# Patient Record
Sex: Male | Born: 2000 | Race: White | Hispanic: No | Marital: Single | Smoking: Never smoker
Health system: Southern US, Community
[De-identification: ages and names within clinical notes are randomized; demographics above are authoritative.]

## PROBLEM LIST (undated history)

## (undated) DIAGNOSIS — K219 Gastro-esophageal reflux disease without esophagitis: Secondary | ICD-10-CM

## (undated) DIAGNOSIS — A770 Spotted fever due to Rickettsia rickettsii: Secondary | ICD-10-CM

## (undated) DIAGNOSIS — R5600 Simple febrile convulsions: Secondary | ICD-10-CM

## (undated) DIAGNOSIS — R569 Unspecified convulsions: Secondary | ICD-10-CM

## (undated) DIAGNOSIS — E669 Obesity, unspecified: Secondary | ICD-10-CM

## (undated) DIAGNOSIS — Z8489 Family history of other specified conditions: Secondary | ICD-10-CM

## (undated) DIAGNOSIS — G43909 Migraine, unspecified, not intractable, without status migrainosus: Secondary | ICD-10-CM

## (undated) DIAGNOSIS — Z8669 Personal history of other diseases of the nervous system and sense organs: Secondary | ICD-10-CM

## (undated) DIAGNOSIS — S83289A Other tear of lateral meniscus, current injury, unspecified knee, initial encounter: Secondary | ICD-10-CM

## (undated) HISTORY — PX: CIRCUMCISION: SUR203

## (undated) HISTORY — PX: NO PAST SURGERIES: SHX2092

## (undated) HISTORY — DX: Migraine, unspecified, not intractable, without status migrainosus: G43.909

---
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
## 2020-02-21 ENCOUNTER — Ambulatory Visit: Payer: Self-pay | Admitting: Physician Assistant

## 2020-02-21 ENCOUNTER — Encounter (HOSPITAL_BASED_OUTPATIENT_CLINIC_OR_DEPARTMENT_OTHER): Payer: Self-pay | Admitting: Orthopedic Surgery

## 2020-02-21 NOTE — H&P (View-Only) (Signed)
Warwick Nick is an 19 y.o. male.   Chief Complaint: left knee pain HPI: History of present illness: Patient is an 19 year-old male who presents with over a year now of left knee pain.  He did mention a distant history back in high school playing basketball when he had an injury to the left knee where he had to be in an immobilizer or brace for several months.  He does not recall ever having an official workup or MRI at that point.  He was told he may have a tear, but again, the symptoms did tend to improve.  Over the last year still dealing with stabbing pain on occasion.  A lot of it is positional.  He does get a lot of mechanical clicking, popping and more importantly the sensation of the knee wanting to give way.  Here for orthopedic evaluation and treatment.  We elected to obtain MRI which shows lateral meniscus tear.    Past Medical History:  Diagnosis Date  . Febrile seizure (HCC)   . GERD (gastroesophageal reflux disease)   . Migraine   . Obese   . [place] spotted fever     Past Surgical History:  Procedure Laterality Date  . CIRCUMCISION     2002    Family History  Problem Relation Age of Onset  . Diabetes Sister   . Anuerysm Paternal Grandfather        Died in his 56's   . Heart disease Other   . Arthritis Other   . Cancer Other   . Asthma Other   . Diabetes Other    Social History:  reports that he has never smoked. He has never used smokeless tobacco. He reports that he does not drink alcohol or use drugs.  Allergies:  Allergies  Allergen Reactions  . Azithromycin Nausea And Vomiting and Swelling    (Not in a hospital admission)   No results found for this or any previous visit (from the past 48 hour(s)). No results found.  Review of Systems  Musculoskeletal: Positive for arthralgias and joint swelling.  All other systems reviewed and are negative.   There were no vitals taken for this visit. Physical Exam  Constitutional: He is oriented to  person, place, and time. He appears well-developed and well-nourished. No distress.  HENT:  Head: Normocephalic and atraumatic.  Eyes: Pupils are equal, round, and reactive to light. Conjunctivae and EOM are normal.  Cardiovascular: Normal rate and intact distal pulses.  Respiratory: Effort normal. No respiratory distress.  GI: Soft. He exhibits no distension.  Musculoskeletal:     Cervical back: Normal range of motion and neck supple.     Left knee: Swelling present. No effusion or erythema. Tenderness present over the lateral joint line.  Neurological: He is alert and oriented to person, place, and time.  Skin: Skin is warm and dry. No rash noted. No erythema.  Psychiatric: He has a normal mood and affect. His behavior is normal.     Assessment/Plan The patient has a known meniscus tear.  He is 19 years old.  I talked to his mom.  He has no insurance coverage.  Unfortunately after talking with mom, his dad works and stated when they were offered insurance through his employment it was too expensive.  Earlier they did not qualify for Medicaid.  I encouraged her to look into that a secondary time.  We can obviously work with the family regarding what needs to be done, which is an  arthroscopy.  The issue is probably as much with cost relative to facility, anesthesia, etc.  I will get my office in touch with the family regarding that.  Hopefully we can find another way to get this covered, but I am going to try to help them to the best of my ability.  I will have [name] touch base with them to stay in touch.    Damaya Channing, PA-C 02/21/2020, 8:58 PM   

## 2020-02-21 NOTE — H&P (Signed)
Angel Kelley is an 19 y.o. male.   Chief Complaint: left knee pain HPI: History of present illness: Patient is an 19 year-old male who presents with over a year now of left knee pain.  He did mention a distant history back in high school playing basketball when he had an injury to the left knee where he had to be in an immobilizer or brace for several months.  He does not recall ever having an official workup or MRI at that point.  He was told he may have a tear, but again, the symptoms did tend to improve.  Over the last year still dealing with stabbing pain on occasion.  A lot of it is positional.  He does get a lot of mechanical clicking, popping and more importantly the sensation of the knee wanting to give way.  Here for orthopedic evaluation and treatment.  We elected to obtain MRI which shows lateral meniscus tear.    Past Medical History:  Diagnosis Date  . Febrile seizure (HCC)   . GERD (gastroesophageal reflux disease)   . Migraine   . Obese   . [place] spotted fever     Past Surgical History:  Procedure Laterality Date  . CIRCUMCISION     2002    Family History  Problem Relation Age of Onset  . Diabetes Sister   . Anuerysm Paternal Grandfather        Died in his 56's   . Heart disease Other   . Arthritis Other   . Cancer Other   . Asthma Other   . Diabetes Other    Social History:  reports that he has never smoked. He has never used smokeless tobacco. He reports that he does not drink alcohol or use drugs.  Allergies:  Allergies  Allergen Reactions  . Azithromycin Nausea And Vomiting and Swelling    (Not in a hospital admission)   No results found for this or any previous visit (from the past 48 hour(s)). No results found.  Review of Systems  Musculoskeletal: Positive for arthralgias and joint swelling.  All other systems reviewed and are negative.   There were no vitals taken for this visit. Physical Exam  Constitutional: He is oriented to  person, place, and time. He appears well-developed and well-nourished. No distress.  HENT:  Head: Normocephalic and atraumatic.  Eyes: Pupils are equal, round, and reactive to light. Conjunctivae and EOM are normal.  Cardiovascular: Normal rate and intact distal pulses.  Respiratory: Effort normal. No respiratory distress.  GI: Soft. He exhibits no distension.  Musculoskeletal:     Cervical back: Normal range of motion and neck supple.     Left knee: Swelling present. No effusion or erythema. Tenderness present over the lateral joint line.  Neurological: He is alert and oriented to person, place, and time.  Skin: Skin is warm and dry. No rash noted. No erythema.  Psychiatric: He has a normal mood and affect. His behavior is normal.     Assessment/Plan The patient has a known meniscus tear.  He is 19 years old.  I talked to his mom.  He has no insurance coverage.  Unfortunately after talking with mom, his dad works and stated when they were offered insurance through his employment it was too expensive.  Earlier they did not qualify for Medicaid.  I encouraged her to look into that a secondary time.  We can obviously work with the family regarding what needs to be done, which is an  arthroscopy.  The issue is probably as much with cost relative to facility, anesthesia, etc.  I will get my office in touch with the family regarding that.  Hopefully we can find another way to get this covered, but I am going to try to help them to the best of my ability.  I will have Angel Kelley touch base with them to stay in touch.    Chriss Czar, PA-C 02/21/2020, 8:58 PM

## 2020-02-22 ENCOUNTER — Other Ambulatory Visit: Payer: Self-pay

## 2020-02-22 ENCOUNTER — Encounter (HOSPITAL_BASED_OUTPATIENT_CLINIC_OR_DEPARTMENT_OTHER): Payer: Self-pay | Admitting: Orthopedic Surgery

## 2020-02-22 NOTE — Progress Notes (Addendum)
Addendum: spoke with [place] zanetto pa ok to proceed    Spoke w/ via phone for pre-op interview---patient Lab needs dos----none               COVID test ------02-26-2020 310 at [facility] Arrive at -------800 am 02-29-2020 NPO after ------midnight Medications to take morning of surgery -----none Diabetic medication -----n/a Patient Special Instructions -----none Pre-Op special Istructions -----none Patient verbalized understanding of instructions that were given at this phone interview. Patient denies shortness of breath, chest pain, fever, cough a this phone interview.  Anesthesia : history of febrile seizures, bmi 43.65 Chart sent to Shanda Bumps zanetto pa for review  PCP: dr Lorin Picket [place] Cardiologist :none Chest x-ray :none EKG :none Echo :none Cardiac Cath : none Sleep Study/ CPAP : Fasting Blood Sugar :      / Checks Blood Sugar -- times a day:  n/a Blood Thinner/ Instructions /Last Dose:n/a ASA / Instructions/ Last Dose : n/a  Patient denies shortness of breath, chest pain, fever, and cough at this phone interview.

## 2020-02-26 ENCOUNTER — Other Ambulatory Visit (HOSPITAL_COMMUNITY)
Admission: RE | Admit: 2020-02-26 | Discharge: 2020-02-26 | Disposition: A | Discharge status: Home / Self Care | Payer: HRSA Program | Source: Ambulatory Visit | Attending: Orthopedic Surgery | Admitting: Orthopedic Surgery

## 2020-02-26 ENCOUNTER — Other Ambulatory Visit: Payer: Self-pay

## 2020-02-26 DIAGNOSIS — Z20822 Contact with and (suspected) exposure to covid-19: Secondary | ICD-10-CM | POA: Diagnosis not present

## 2020-02-26 DIAGNOSIS — Z01812 Encounter for preprocedural laboratory examination: Secondary | ICD-10-CM | POA: Diagnosis present

## 2020-02-26 LAB — SARS CORONAVIRUS 2 (TAT 6-24 HRS): SARS Coronavirus 2: NEGATIVE

## 2020-02-28 MED ORDER — DEXTROSE 5 % IV SOLN
3.0000 g | INTRAVENOUS | Status: AC
  Administered 2020-02-29: 3 g via INTRAVENOUS
  Filled 2020-02-28: qty 3000

## 2020-02-28 MED ORDER — DEXTROSE 5 % IV SOLN
3.0000 g | INTRAVENOUS | Status: DC
  Filled 2020-02-28: qty 3000

## 2020-02-29 ENCOUNTER — Ambulatory Visit (HOSPITAL_BASED_OUTPATIENT_CLINIC_OR_DEPARTMENT_OTHER): Payer: Self-pay | Admitting: Physician Assistant

## 2020-02-29 ENCOUNTER — Ambulatory Visit (HOSPITAL_BASED_OUTPATIENT_CLINIC_OR_DEPARTMENT_OTHER)
Admission: RE | Admit: 2020-02-29 | Discharge: 2020-02-29 | Disposition: A | Discharge status: Home / Self Care | Payer: Self-pay | Attending: Orthopedic Surgery | Admitting: Orthopedic Surgery

## 2020-02-29 ENCOUNTER — Encounter (HOSPITAL_BASED_OUTPATIENT_CLINIC_OR_DEPARTMENT_OTHER): Payer: Self-pay | Admitting: Orthopedic Surgery

## 2020-02-29 ENCOUNTER — Other Ambulatory Visit: Payer: Self-pay

## 2020-02-29 ENCOUNTER — Encounter (HOSPITAL_BASED_OUTPATIENT_CLINIC_OR_DEPARTMENT_OTHER)
Admission: RE | Disposition: A | Discharge status: Home / Self Care | Payer: Self-pay | Source: Home / Self Care | Attending: Orthopedic Surgery

## 2020-02-29 DIAGNOSIS — K219 Gastro-esophageal reflux disease without esophagitis: Secondary | ICD-10-CM | POA: Insufficient documentation

## 2020-02-29 DIAGNOSIS — S83282A Other tear of lateral meniscus, current injury, left knee, initial encounter: Secondary | ICD-10-CM | POA: Insufficient documentation

## 2020-02-29 DIAGNOSIS — Z68.41 Body mass index (BMI) pediatric, greater than or equal to 95th percentile for age: Secondary | ICD-10-CM | POA: Insufficient documentation

## 2020-02-29 DIAGNOSIS — X58XXXA Exposure to other specified factors, initial encounter: Secondary | ICD-10-CM | POA: Insufficient documentation

## 2020-02-29 HISTORY — DX: Simple febrile convulsions: R56.00

## 2020-02-29 HISTORY — DX: Personal history of other diseases of the nervous system and sense organs: Z86.69

## 2020-02-29 HISTORY — DX: Gastro-esophageal reflux disease without esophagitis: K21.9

## 2020-02-29 HISTORY — DX: Family history of other specified conditions: Z84.89

## 2020-02-29 HISTORY — DX: Other tear of lateral meniscus, current injury, unspecified knee, initial encounter: S83.289A

## 2020-02-29 HISTORY — DX: Obesity, unspecified: E66.9

## 2020-02-29 HISTORY — PX: KNEE ARTHROSCOPY WITH LATERAL MENISECTOMY: SHX6193

## 2020-02-29 SURGERY — ARTHROSCOPY, KNEE, WITH LATERAL MENISCECTOMY
Anesthesia: General | Site: Knee | Laterality: Left

## 2020-02-29 MED ORDER — SODIUM CHLORIDE 0.9 % IV SOLN
INTRAVENOUS | Status: DC
  Filled 2020-02-29: qty 1000

## 2020-02-29 MED ORDER — HYDROCODONE-ACETAMINOPHEN 5-325 MG PO TABS
1.0000 | ORAL_TABLET | ORAL | Status: DC | PRN
  Filled 2020-02-29: qty 2

## 2020-02-29 MED ORDER — ACETAMINOPHEN 500 MG PO TABS
ORAL_TABLET | ORAL | Status: AC
  Filled 2020-02-29: qty 2

## 2020-02-29 MED ORDER — FENTANYL CITRATE (PF) 100 MCG/2ML IJ SOLN
INTRAMUSCULAR | Status: AC
  Filled 2020-02-29: qty 2

## 2020-02-29 MED ORDER — CHLORHEXIDINE GLUCONATE 4 % EX LIQD
60.0000 mL | Freq: Once | CUTANEOUS | Status: DC
  Filled 2020-02-29: qty 118

## 2020-02-29 MED ORDER — CELECOXIB 200 MG PO CAPS
200.0000 mg | ORAL_CAPSULE | Freq: Two times a day (BID) | ORAL | Status: DC
  Filled 2020-02-29: qty 1

## 2020-02-29 MED ORDER — ACETAMINOPHEN 325 MG PO TABS
325.0000 mg | ORAL_TABLET | Freq: Four times a day (QID) | ORAL | Status: DC | PRN
  Filled 2020-02-29: qty 2

## 2020-02-29 MED ORDER — ONDANSETRON HCL 4 MG PO TABS
4.0000 mg | ORAL_TABLET | Freq: Four times a day (QID) | ORAL | Status: DC | PRN
  Filled 2020-02-29: qty 1

## 2020-02-29 MED ORDER — DOCUSATE SODIUM 100 MG PO CAPS
100.0000 mg | ORAL_CAPSULE | Freq: Two times a day (BID) | ORAL | Status: DC
  Filled 2020-02-29: qty 1

## 2020-02-29 MED ORDER — CEFAZOLIN SODIUM-DEXTROSE 2-4 GM/100ML-% IV SOLN
INTRAVENOUS | Status: AC
  Filled 2020-02-29: qty 100

## 2020-02-29 MED ORDER — MIDAZOLAM HCL 2 MG/2ML IJ SOLN
INTRAMUSCULAR | Status: AC
  Filled 2020-02-29: qty 2

## 2020-02-29 MED ORDER — METOCLOPRAMIDE HCL 5 MG/ML IJ SOLN
5.0000 mg | Freq: Three times a day (TID) | INTRAMUSCULAR | Status: DC | PRN
  Filled 2020-02-29: qty 2

## 2020-02-29 MED ORDER — ACETAMINOPHEN 500 MG PO TABS
1000.0000 mg | ORAL_TABLET | Freq: Once | ORAL | Status: DC
  Filled 2020-02-29: qty 2

## 2020-02-29 MED ORDER — ONDANSETRON HCL 4 MG/2ML IJ SOLN
INTRAMUSCULAR | Status: DC | PRN
  Administered 2020-02-29: 4 mg via INTRAVENOUS

## 2020-02-29 MED ORDER — CEFAZOLIN SODIUM-DEXTROSE 1-4 GM/50ML-% IV SOLN
INTRAVENOUS | Status: AC
  Filled 2020-02-29: qty 50

## 2020-02-29 MED ORDER — ACETAMINOPHEN 325 MG PO TABS
ORAL_TABLET | ORAL | Status: DC | PRN
  Administered 2020-02-29: 1000 mg via ORAL

## 2020-02-29 MED ORDER — SODIUM CHLORIDE 0.9 % IR SOLN
Status: DC | PRN
  Administered 2020-02-29: 6000 mL

## 2020-02-29 MED ORDER — PROPOFOL 10 MG/ML IV BOLUS
INTRAVENOUS | Status: DC | PRN
  Administered 2020-02-29: 300 mg via INTRAVENOUS

## 2020-02-29 MED ORDER — MIDAZOLAM HCL 2 MG/2ML IJ SOLN
INTRAMUSCULAR | Status: DC | PRN
  Administered 2020-02-29: 2 mg via INTRAVENOUS

## 2020-02-29 MED ORDER — FENTANYL CITRATE (PF) 100 MCG/2ML IJ SOLN
25.0000 ug | INTRAMUSCULAR | Status: DC | PRN
  Administered 2020-02-29: 25 ug via INTRAVENOUS
  Filled 2020-02-29: qty 1

## 2020-02-29 MED ORDER — LACTATED RINGERS IV SOLN
INTRAVENOUS | Status: DC
  Administered 2020-02-29: 09:00:00 50 mL/h via INTRAVENOUS
  Filled 2020-02-29: qty 1000

## 2020-02-29 MED ORDER — PROPOFOL 10 MG/ML IV BOLUS
INTRAVENOUS | Status: AC
  Filled 2020-02-29: qty 20

## 2020-02-29 MED ORDER — MORPHINE SULFATE (PF) 2 MG/ML IV SOLN
0.5000 mg | INTRAVENOUS | Status: DC | PRN
  Filled 2020-02-29: qty 0.5

## 2020-02-29 MED ORDER — ONDANSETRON HCL 4 MG/2ML IJ SOLN
4.0000 mg | Freq: Four times a day (QID) | INTRAMUSCULAR | Status: DC | PRN
  Filled 2020-02-29: qty 2

## 2020-02-29 MED ORDER — METOCLOPRAMIDE HCL 5 MG PO TABS
5.0000 mg | ORAL_TABLET | Freq: Three times a day (TID) | ORAL | Status: DC | PRN
  Filled 2020-02-29: qty 2

## 2020-02-29 MED ORDER — KETOROLAC TROMETHAMINE 30 MG/ML IJ SOLN
INTRAMUSCULAR | Status: DC | PRN
  Administered 2020-02-29: 30 mg via INTRAVENOUS

## 2020-02-29 MED ORDER — FENTANYL CITRATE (PF) 100 MCG/2ML IJ SOLN
INTRAMUSCULAR | Status: DC | PRN
  Administered 2020-02-29: 25 ug via INTRAVENOUS
  Administered 2020-02-29 (×2): 50 ug via INTRAVENOUS

## 2020-02-29 MED ORDER — LIDOCAINE 2% (20 MG/ML) 5 ML SYRINGE
INTRAMUSCULAR | Status: DC | PRN
  Administered 2020-02-29: 100 mg via INTRAVENOUS

## 2020-02-29 MED ORDER — DEXAMETHASONE SODIUM PHOSPHATE 10 MG/ML IJ SOLN
INTRAMUSCULAR | Status: DC | PRN
  Administered 2020-02-29 (×2): 5 mg via INTRAVENOUS

## 2020-02-29 MED ORDER — HYDROCODONE-ACETAMINOPHEN 5-325 MG PO TABS: 1.0000 | ORAL_TABLET | ORAL | 0 refills | Status: DC | PRN

## 2020-02-29 MED ORDER — BUPIVACAINE-EPINEPHRINE 0.5% -1:200000 IJ SOLN
INTRAMUSCULAR | Status: DC | PRN
  Administered 2020-02-29: 20 mL

## 2020-02-29 MED ORDER — WHITE PETROLATUM EX OINT
TOPICAL_OINTMENT | CUTANEOUS | Status: AC
  Filled 2020-02-29: qty 5

## 2020-02-29 SURGICAL SUPPLY — 40 items
BANDAGE ESMARK 6X9 LF (GAUZE/BANDAGES/DRESSINGS) ×1 IMPLANT
BLADE EXCALIBUR 4.0MM X 13CM (MISCELLANEOUS) ×1
BLADE EXCALIBUR 4.0X13 (MISCELLANEOUS) ×2 IMPLANT
BNDG ELASTIC 6X5.8 VLCR STR LF (GAUZE/BANDAGES/DRESSINGS) ×3 IMPLANT
BNDG ESMARK 6X9 LF (GAUZE/BANDAGES/DRESSINGS) ×3
BNDG GAUZE ELAST 4 BULKY (GAUZE/BANDAGES/DRESSINGS) ×3 IMPLANT
CHLORAPREP W/TINT 26 (MISCELLANEOUS) ×3 IMPLANT
COVER WAND RF STERILE (DRAPES) ×3 IMPLANT
DISSECTOR 4.0MM X 13CM (MISCELLANEOUS) IMPLANT
DRAPE ARTHROSCOPY W/POUCH 114 (DRAPES) ×3 IMPLANT
DRSG EMULSION OIL 3X3 NADH (GAUZE/BANDAGES/DRESSINGS) ×3 IMPLANT
DRSG PAD ABDOMINAL 8X10 ST (GAUZE/BANDAGES/DRESSINGS) ×3 IMPLANT
EXCALIBUR 3.8MM X 13CM (MISCELLANEOUS) IMPLANT
GAUZE SPONGE 4X4 12PLY STRL (GAUZE/BANDAGES/DRESSINGS) ×3 IMPLANT
GAUZE SPONGE 4X4 12PLY STRL LF (GAUZE/BANDAGES/DRESSINGS) ×3 IMPLANT
GLOVE BIO SURGEON STRL SZ7.5 (GLOVE) ×3 IMPLANT
GLOVE BIOGEL PI IND STRL 8 (GLOVE) ×2 IMPLANT
GLOVE BIOGEL PI INDICATOR 8 (GLOVE) ×4
GLOVE SURG ORTHO 8.0 STRL STRW (GLOVE) ×3 IMPLANT
GOWN STRL REUS W/ TWL LRG LVL3 (GOWN DISPOSABLE) ×1 IMPLANT
GOWN STRL REUS W/ TWL XL LVL3 (GOWN DISPOSABLE) ×1 IMPLANT
GOWN STRL REUS W/TWL LRG LVL3 (GOWN DISPOSABLE) ×3
GOWN STRL REUS W/TWL XL LVL3 (GOWN DISPOSABLE) ×6 IMPLANT
HOLDER KNEE FOAM BLUE (MISCELLANEOUS) ×3 IMPLANT
IV NS IRRIG 3000ML ARTHROMATIC (IV SOLUTION) ×6 IMPLANT
MANIFOLD NEPTUNE II (INSTRUMENTS) ×3 IMPLANT
NDL SAFETY ECLIPSE 18X1.5 (NEEDLE) ×1 IMPLANT
NEEDLE HYPO 18GX1.5 SHARP (NEEDLE) ×3
NS IRRIG 500ML POUR BTL (IV SOLUTION) ×3 IMPLANT
PACK ARTHROSCOPY DSU (CUSTOM PROCEDURE TRAY) ×3 IMPLANT
PACK BASIN DAY SURGERY FS (CUSTOM PROCEDURE TRAY) ×3 IMPLANT
PORT APPOLLO RF 90DEGREE MULTI (SURGICAL WAND) ×3 IMPLANT
PROBE BIPOLAR ARTHRO 85MM 30D (MISCELLANEOUS) IMPLANT
SUT ETHILON 4 0 PS 2 18 (SUTURE) IMPLANT
SYR 5ML LL (SYRINGE) IMPLANT
TUBE CONNECTING 12'X1/4 (SUCTIONS) ×1
TUBE CONNECTING 12X1/4 (SUCTIONS) ×2 IMPLANT
TUBING ARTHROSCOPY IRRIG 16FT (MISCELLANEOUS) ×3 IMPLANT
WATER STERILE IRR 1000ML POUR (IV SOLUTION) IMPLANT
WRAP KNEE MAXI GEL POST OP (GAUZE/BANDAGES/DRESSINGS) ×3 IMPLANT

## 2020-02-29 NOTE — Op Note (Signed)
NAMEMYKLE, PASCUA MEDICAL RECORD [id number] ACCOUNT [id number] DATE OF BIRTH:08-Jun-2001 FACILITY: [facility] LOCATION: [facility] PHYSICIAN:W. Travoris Bushey JR., MD  OPERATIVE REPORT  DATE OF PROCEDURE:  02/29/2020  PREOPERATIVE DIAGNOSIS:  Horizontal cleavage tear, lateral meniscus, left knee.  POSTOPERATIVE DIAGNOSIS:  Horizontal cleavage tear, lateral meniscus, left knee.  OPERATION:  Left knee partial lateral meniscectomies (30%).  SURGEON:  Marcie Mowers, MD  ANESTHESIA:  General anesthetic with local.  DESCRIPTION OF PROCEDURE:  General anesthetic, leg holder.  Inferomedial and inferolateral portals created.  Systematic inspection of the knee showed the patellofemoral area, medial compartment, medial meniscus and ACL and PCL to be normal.  Horizontal cleavage tear of the lateral meniscus.  We had to take this back close to the popliteal hiatus.  We did not violate it.  It was probably beginning at about the middle of the lateral meniscus, extending somewhat posteriorly.  We had to take the  cleavage back.  Probably estimated about 30% of the meniscus was resected.  There was no chondral irregularity.  Knee drained free of fluid.  Portals were closed with nylon, infiltrated the joint and the skin with 20 mL of 0.5% Marcaine.  A lightly  compressive sterile dressing applied.  VN/NUANCE  D:02/29/2020 T:02/29/2020 JOB:[id number]

## 2020-02-29 NOTE — Discharge Instructions (Signed)
Diet: As you were doing prior to hospitalization   Activity: Increase activity slowly as tolerated  No lifting or driving for 48 hours  Shower: May shower without a dressing on post op day #3, NO SOAKING in tub   Dressing: You may change your dressing on post op day #3.  Then change the dressing daily with sterile 4"x4"s gauze dressing  Or band aids.  Weight Bearing: weight bearing as tolerated.  To prevent constipation: you may use a stool softener such as -  Colace ( over the counter) 100 mg by mouth twice a day  Drink plenty of fluids ( prune juice may be helpful) and high fiber foods  Miralax ( over the counter) for constipation as needed.   Precautions: If you experience chest pain or shortness of breath - call 911 immediately For transfer to the hospital emergency department!!  If you develop a fever greater that 101 F, purulent drainage from wound, increased redness or drainage from wound, or calf pain -- Call the office   Follow- Up Appointment: Please call for an appointment to be seen in 2 weeks  [place] - [phone]   Post Anesthesia Home Care Instructions  Activity: Get plenty of rest for the remainder of the day. A responsible individual must stay with you for 24 hours following the procedure.  For the next 24 hours, DO NOT: -Drive a car -Advertising copywriter -Drink alcoholic beverages -Take any medication unless instructed by your physician -Make any legal decisions or sign important papers.  Meals: Start with liquid foods such as gelatin or soup. Progress to regular foods as tolerated. Avoid greasy, spicy, heavy foods. If nausea and/or vomiting occur, drink only clear liquids until the nausea and/or vomiting subsides. Call your physician if vomiting continues.  Special Instructions/Symptoms: Your throat may feel dry or sore from the anesthesia or the breathing tube placed in your throat during surgery. If this causes discomfort, gargle with warm salt water.  The discomfort should disappear within 24 hours.    NO MOTRIN, IBUPROFEN, ALEVE, OR ADVIL UNTIL AFTER 6:30 PM TONIGHT.

## 2020-02-29 NOTE — Interval H&P Note (Signed)
History and Physical Interval Note:  02/29/2020 9:30 AM  Angel Kelley  has presented today for surgery, with the diagnosis of LEFT KNEE LATERAL MENISCUS TEAR.  The various methods of treatment have been discussed with the patient and family. After consideration of risks, benefits and other options for treatment, the patient has consented to  Procedure(s): KNEE ARTHROSCOPY WITH LATERAL MENISECTOMY (Left) as a surgical intervention.  The patient's history has been reviewed, patient examined, no change in status, stable for surgery.  I have reviewed the patient's chart and labs.  Questions were answered to the patient's satisfaction.     Thera Flake

## 2020-02-29 NOTE — Transfer of Care (Signed)
Immediate Anesthesia Transfer of Care Note  Patient: Angel Kelley  Procedure(s) Performed: Procedure(s) (LRB): KNEE ARTHROSCOPY WITH LATERAL MENISECTOMY (Left)  Patient Location: PACU  Anesthesia Type: General  Level of Consciousness: awake, oriented, sedated and patient cooperative  Airway & Oxygen Therapy: Patient Spontanous Breathing and Patient connected to face mask oxygen  Post-op Assessment: Report given to PACU RN and Post -op Vital signs reviewed and stable  Post vital signs: Reviewed and stable  Complications: No apparent anesthesia complications Last Vitals:  Vitals Value Taken Time  BP 124/66 02/29/20 1100  Temp    Pulse 82 02/29/20 1103  Resp 22 02/29/20 1103  SpO2 96 % 02/29/20 1103  Vitals shown include unvalidated device data.  Last Pain:  Vitals:   02/29/20 0824  TempSrc: Oral  PainSc: 0-No pain      Patients Stated Pain Goal: 6 (02/29/20 0824)

## 2020-02-29 NOTE — Anesthesia Procedure Notes (Signed)
Procedure Name: LMA Insertion Performed by: Jasai Sorg D, CRNA Pre-anesthesia Checklist: Patient identified, Emergency Drugs available, Suction available and Patient being monitored Patient Re-evaluated:Patient Re-evaluated prior to induction Oxygen Delivery Method: Circle system utilized Preoxygenation: Pre-oxygenation with 100% oxygen Induction Type: IV induction Ventilation: Mask ventilation without difficulty LMA: LMA inserted LMA Size: 5.0 Number of attempts: 1 Airway Equipment and Method: Bite block Placement Confirmation: positive ETCO2 Tube secured with: Tape Dental Injury: Teeth and Oropharynx as per pre-operative assessment         

## 2020-02-29 NOTE — Anesthesia Preprocedure Evaluation (Addendum)
Anesthesia Evaluation  Patient identified by MRN, date of birth, ID band Patient awake    Reviewed: Allergy & Precautions, NPO status , Patient's Chart, lab work & pertinent test results  Airway Mallampati: III  TM Distance: >3 FB Neck ROM: Full    Dental  (+) Poor Dentition, Dental Advisory Given   Pulmonary neg pulmonary ROS,    Pulmonary exam normal breath sounds clear to auscultation       Cardiovascular negative cardio ROS Normal cardiovascular exam Rhythm:Regular Rate:Normal     Neuro/Psych  Headaches, Seizures - (last seizure 2 years ago, not on anti-epileptics), Well Controlled,  negative psych ROS   GI/Hepatic Neg liver ROS, GERD  Medicated and Controlled,  Endo/Other  Morbid obesity (BMI 45)  Renal/GU negative Renal ROS  negative genitourinary   Musculoskeletal negative musculoskeletal ROS (+)   Abdominal   Peds  Hematology negative hematology ROS (+)   Anesthesia Other Findings   Reproductive/Obstetrics                            Anesthesia Physical Anesthesia Plan  ASA: III  Anesthesia Plan: General   Post-op Pain Management:    Induction: Intravenous  PONV Risk Score and Plan: 2 and Ondansetron, Dexamethasone and Midazolam  Airway Management Planned: LMA  Additional Equipment:   Intra-op Plan:   Post-operative Plan: Extubation in OR  Informed Consent: I have reviewed the patients History and Physical, chart, labs and discussed the procedure including the risks, benefits and alternatives for the proposed anesthesia with the patient or authorized representative who has indicated his/her understanding and acceptance.     Dental advisory given  Plan Discussed with: CRNA  Anesthesia Plan Comments:         Anesthesia Quick Evaluation

## 2020-03-04 NOTE — Anesthesia Postprocedure Evaluation (Signed)
Anesthesia Post Note  Patient: Angel Kelley  Procedure(s) Performed: KNEE ARTHROSCOPY WITH LATERAL MENISECTOMY (Left Knee)     Patient location during evaluation: PACU Anesthesia Type: General Level of consciousness: awake and alert Pain management: pain level controlled Vital Signs Assessment: post-procedure vital signs reviewed and stable Respiratory status: spontaneous breathing, nonlabored ventilation, respiratory function stable and patient connected to nasal cannula oxygen Cardiovascular status: blood pressure returned to baseline and stable Postop Assessment: no apparent nausea or vomiting Anesthetic complications: no    Last Vitals:  Vitals:   02/29/20 1145 02/29/20 1200  BP: 119/63 122/78  Pulse: 71 70  Resp: 18 20  Temp:  36.7 C  SpO2: 97% 99%    Last Pain:  Vitals:   02/29/20 1200  TempSrc:   PainSc: 2    Pain Goal: Patients Stated Pain Goal: 6 (02/29/20 0824)                 Bradee Common L [facility]

## 2020-07-14 ENCOUNTER — Emergency Department (HOSPITAL_COMMUNITY)
Admission: EM | Admit: 2020-07-14 | Discharge: 2020-07-14 | Disposition: A | Discharge status: Home / Self Care | Payer: Self-pay | Attending: Emergency Medicine | Admitting: Emergency Medicine

## 2020-07-14 ENCOUNTER — Other Ambulatory Visit: Payer: Self-pay

## 2020-07-14 ENCOUNTER — Encounter (HOSPITAL_COMMUNITY): Payer: Self-pay | Admitting: *Deleted

## 2020-07-14 ENCOUNTER — Emergency Department (HOSPITAL_COMMUNITY): Payer: Self-pay

## 2020-07-14 DIAGNOSIS — R110 Nausea: Secondary | ICD-10-CM | POA: Insufficient documentation

## 2020-07-14 DIAGNOSIS — R1031 Right lower quadrant pain: Secondary | ICD-10-CM

## 2020-07-14 LAB — COMPREHENSIVE METABOLIC PANEL
ALT: 33 U/L (ref 0–44)
AST: 22 U/L (ref 15–41)
Albumin: 4.4 g/dL (ref 3.5–5.0)
Alkaline Phosphatase: 43 U/L (ref 38–126)
Anion gap: 9 (ref 5–15)
BUN: 20 mg/dL (ref 6–20)
CO2: 24 mmol/L (ref 22–32)
Calcium: 9.3 mg/dL (ref 8.9–10.3)
Chloride: 106 mmol/L (ref 98–111)
Creatinine, Ser: 1.19 mg/dL (ref 0.61–1.24)
GFR calc Af Amer: >60 mL/min (ref >60)
GFR calc non Af Amer: >60 mL/min (ref >60)
Glucose, Bld: 97 mg/dL (ref 70–99)
Potassium: 4.1 mmol/L (ref 3.5–5.1)
Sodium: 139 mmol/L (ref 135–145)
Total Bilirubin: 0.6 mg/dL (ref 0.3–1.2)
Total Protein: 7.5 g/dL (ref 6.5–8.1)

## 2020-07-14 LAB — CBC
HCT: 43.1 % (ref 39.0–52.0)
Hemoglobin: 14.6 g/dL (ref 13.0–17.0)
MCH: 29.4 pg (ref 26.0–34.0)
MCHC: 33.9 g/dL (ref 30.0–36.0)
MCV: 86.9 fL (ref 80.0–100.0)
Platelets: 249 10*3/uL (ref 150–400)
RBC: 4.96 MIL/uL (ref 4.22–5.81)
RDW: 12.4 % (ref 11.5–15.5)
WBC: 11 10*3/uL — ABNORMAL HIGH (ref 4.0–10.5)
nRBC: 0 % (ref 0.0–0.2)

## 2020-07-14 LAB — LIPASE, BLOOD: Lipase: 31 U/L (ref 11–51)

## 2020-07-14 MED ORDER — ONDANSETRON HCL 4 MG/2ML IJ SOLN
4.0000 mg | Freq: Once | INTRAMUSCULAR | Status: AC
  Administered 2020-07-14: 4 mg via INTRAVENOUS
  Filled 2020-07-14: qty 2

## 2020-07-14 MED ORDER — SODIUM CHLORIDE 0.9 % IV SOLN: INTRAVENOUS | Status: DC

## 2020-07-14 MED ORDER — ONDANSETRON 4 MG PO TBDP: 4.0000 mg | ORAL_TABLET | Freq: Three times a day (TID) | ORAL | 1 refills | Status: DC | PRN

## 2020-07-14 MED ORDER — SODIUM CHLORIDE 0.9% FLUSH: 3.0000 mL | Freq: Once | INTRAVENOUS | Status: DC

## 2020-07-14 MED ORDER — HYDROMORPHONE HCL 1 MG/ML IJ SOLN
1.0000 mg | Freq: Once | INTRAMUSCULAR | Status: AC
  Administered 2020-07-14: 1 mg via INTRAVENOUS
  Filled 2020-07-14: qty 1

## 2020-07-14 MED ORDER — IOHEXOL 300 MG/ML  SOLN
100.0000 mL | Freq: Once | INTRAMUSCULAR | Status: AC | PRN
  Administered 2020-07-14: 100 mL via INTRAVENOUS

## 2020-07-14 MED ORDER — HYDROCODONE-ACETAMINOPHEN 5-325 MG PO TABS: 1.0000 | ORAL_TABLET | Freq: Four times a day (QID) | ORAL | 0 refills | Status: DC | PRN

## 2020-07-14 NOTE — ED Triage Notes (Signed)
Right lower quadrant pain onset 3 days ago

## 2020-07-14 NOTE — Discharge Instructions (Signed)
Take the Zofran as needed for nausea.  Make an appointment to follow-up with your primary care doctor.  Return for any new or worse symptoms.  CT scan of the abdomen had no acute findings.  Take the hydrocodone as needed for pain.  Work note provided.

## 2020-07-15 MED FILL — Hydrocodone-Acetaminophen Tab 5-325 MG: ORAL | Qty: 6 | Status: AC

## 2020-07-15 NOTE — ED Provider Notes (Signed)
[facility] EMERGENCY DEPARTMENT Provider Note   CSN: [id number] Arrival date & time: 07/14/20  1503     History Chief Complaint  Patient presents with  . Abdominal Pain    Angel Kelley is a 19 y.o. male.  Patient with complaint of right lower quadrant abdominal pain that started on Friday.  Was intermittent.  Became constant today.  Associated with nausea but no vomiting.  No fevers no dysuria.        Past Medical History:  Diagnosis Date  . Family history of adverse reaction to anesthesia    mother ponv  . Febrile seizure (HCC) last seizure 2 yrs ago   no cause found, no neurologist  . GERD (gastroesophageal reflux disease)   . History of migraine   . Lateral meniscus tear    left  . Obese   . Rocky [place] spotted fever 9 yrs ago    Patient Active Problem List   Diagnosis Date Noted  . Abnormal electroencephalogram (EEG) 09/23/2014  . Obesity 09/23/2014  . Acanthosis nigricans, acquired 09/23/2014  . 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 Sprain of ankle, left 01/11/2013    Past Surgical History:  Procedure Laterality Date  . CIRCUMCISION     2002  . KNEE ARTHROSCOPY WITH LATERAL MENISECTOMY Left 02/29/2020   Procedure: KNEE ARTHROSCOPY WITH LATERAL MENISECTOMY;  Surgeon: Frederico Hamman, MD;  Location: [facility];  Service: Orthopedics;  Laterality: Left;  . NO PAST SURGERIES         Family History  Problem Relation Age of Onset  . Diabetes Sister   . Anuerysm Paternal Grandfather        Died in his 63's   . Heart disease Other   . Arthritis Other   . Cancer Other   . Asthma Other   . Diabetes Other     Social History   Tobacco Use  . Smoking status: Never Smoker  . Smokeless tobacco: Never Used  Vaping Use  . Vaping Use: Never used  Substance Use Topics  . Alcohol use: No  . Drug use: No    Home Medications Prior to Admission medications   Medication Sig Start Date End  Date Taking? Authorizing Provider  HYDROcodone-acetaminophen (NORCO/VICODIN) 5-325 MG tablet Take 1 tablet by mouth every 4 (four) hours as needed for moderate pain (may need 1-2 first couple days after surgery). 02/29/20   Chadwell, Ivin Booty, PA-C  HYDROcodone-acetaminophen (NORCO/VICODIN) 5-325 MG tablet Take 1 tablet by mouth every 6 (six) hours as needed for moderate pain. 07/14/20   Vanetta Mulders, MD  ondansetron (ZOFRAN ODT) 4 MG disintegrating tablet Take 1 tablet (4 mg total) by mouth every 8 (eight) hours as needed. 07/14/20   Vanetta Mulders, MD    Allergies    Azithromycin  Review of Systems   Review of Systems  Constitutional: Negative for chills and fever.  HENT: Negative for congestion, rhinorrhea and sore throat.   Eyes: Negative for visual disturbance.  Respiratory: Negative for cough and shortness of breath.   Cardiovascular: Negative for chest pain and leg swelling.  Gastrointestinal: Positive for abdominal pain and nausea. Negative for diarrhea and vomiting.  Genitourinary: Negative for dysuria.  Musculoskeletal: Negative for back pain and neck pain.  Skin: Negative for rash.  Neurological: Negative for dizziness, light-headedness and headaches.  Hematological: Does not bruise/bleed easily.  Psychiatric/Behavioral: Negative for confusion.    Physical Exam Updated Vital Signs BP 115/66 (BP Location: Left Arm)  Pulse 73   Temp 98.6 F (37 C) (Oral)   Resp 17   Ht 1.905 m (6\' 3" )   Wt (!) 158.8 kg   SpO2 99%   BMI 43.75 kg/m   Physical Exam Vitals and nursing note reviewed.  Constitutional:      Appearance: Normal appearance. He is well-developed. He is not ill-appearing.  HENT:     Head: Normocephalic and atraumatic.  Eyes:     Conjunctiva/sclera: Conjunctivae normal.     Pupils: Pupils are equal, round, and reactive to light.  Cardiovascular:     Rate and Rhythm: Normal rate and regular rhythm.     Heart sounds: No murmur heard.   Pulmonary:      Effort: Pulmonary effort is normal. No respiratory distress.     Breath sounds: Normal breath sounds.  Abdominal:     Palpations: Abdomen is soft.     Tenderness: There is abdominal tenderness. There is guarding.     Comments: Tenderness right lower quadrant.  With some guarding.  Musculoskeletal:        General: No swelling. Normal range of motion.     Cervical back: Neck supple.  Skin:    General: Skin is warm and dry.  Neurological:     General: No focal deficit present.     Mental Status: He is alert and oriented to person, place, and time.     Cranial Nerves: No cranial nerve deficit.     Sensory: No sensory deficit.     Motor: No weakness.     ED Results / Procedures / Treatments   Labs (all labs ordered are listed, but only abnormal results are displayed) Labs Reviewed  CBC - Abnormal; Notable for the following components:      Result Value   WBC 11.0 (*)    All other components within normal limits  LIPASE, BLOOD  COMPREHENSIVE METABOLIC PANEL  URINALYSIS, ROUTINE W REFLEX MICROSCOPIC    EKG None  Radiology CT Abdomen Pelvis W Contrast  Result Date: 07/14/2020 CLINICAL DATA:  Right lower quadrant pain EXAM: CT ABDOMEN AND PELVIS WITH CONTRAST TECHNIQUE: Multidetector CT imaging of the abdomen and pelvis was performed using the standard protocol following bolus administration of intravenous contrast. CONTRAST:  09/13/2020 OMNIPAQUE IOHEXOL 300 MG/ML  SOLN COMPARISON:  06/30/2011 FINDINGS: Lower chest: Lung bases are clear. No effusions. Heart is normal size. Hepatobiliary: No focal hepatic abnormality. Gallbladder unremarkable. Pancreas: No focal abnormality or ductal dilatation. Spleen: No focal abnormality.  Normal size. Adrenals/Urinary Tract: No adrenal abnormality. No focal renal abnormality. No stones or hydronephrosis. Urinary bladder is unremarkable. Stomach/Bowel: Normal appendix. Stomach, large and small bowel grossly unremarkable. Vascular/Lymphatic: No evidence of  aneurysm or adenopathy. Reproductive: No visible focal abnormality. Other: No free fluid or free air. Musculoskeletal: No acute bony abnormality. IMPRESSION: Normal appendix. No acute findings in the abdomen or pelvis. Electronically Signed   By: 07/02/2011 M.D.   On: 07/14/2020 22:54    Procedures Procedures (including critical care time)  Medications Ordered in ED Medications  sodium chloride flush (NS) 0.9 % injection 3 mL (3 mLs Intravenous Not Given 07/14/20 2139)  0.9 %  sodium chloride infusion (0 mLs Intravenous Stopped 07/14/20 2351)  ondansetron (ZOFRAN) injection 4 mg (4 mg Intravenous Given 07/14/20 2232)  HYDROmorphone (DILAUDID) injection 1 mg (1 mg Intravenous Given 07/14/20 2232)  iohexol (OMNIPAQUE) 300 MG/ML solution 100 mL (100 mLs Intravenous Contrast Given 07/14/20 2242)    ED Course  I have reviewed  the triage vital signs and the nursing notes.  Pertinent labs & imaging results that were available during my care of the patient were reviewed by me and considered in my medical decision making (see chart for details).    MDM Rules/Calculators/A&P                          Clinically concern was for appendicitis.  Patient had a mild a leukocytosis at 11,000.  But otherwise labs were normal.  CT scan had no acute findings.  Gallbladder area was normal appendix was normal no adenopathy.  No evidence of any kidney stones.  Patient without any dysuria.  Was significantly tender right lower quadrant and even had some guarding.  Also no evidence of hernia.  Patient be treated symptomatically and follow-up with his primary care doctor.  Return for any new or worse symptoms.  Patient stable for discharge home    Final Clinical Impression(s) / ED Diagnoses Final diagnoses:  Right lower quadrant abdominal pain    Rx / DC Orders ED Discharge Orders         Ordered    ondansetron (ZOFRAN ODT) 4 MG disintegrating tablet  Every 8 hours PRN     Discontinue  Reprint     07/14/20 2340     HYDROcodone-acetaminophen (NORCO/VICODIN) 5-325 MG tablet  Every 6 hours PRN     Discontinue  Reprint     07/14/20 2343           Vanetta Mulders, MD 07/15/20 0004

## 2020-12-01 ENCOUNTER — Emergency Department (HOSPITAL_COMMUNITY): Payer: Self-pay

## 2020-12-01 ENCOUNTER — Other Ambulatory Visit: Payer: Self-pay

## 2020-12-01 ENCOUNTER — Emergency Department (HOSPITAL_COMMUNITY)
Admission: EM | Admit: 2020-12-01 | Discharge: 2020-12-02 | Disposition: A | Discharge status: Home / Self Care | Payer: Self-pay | Attending: Emergency Medicine | Admitting: Emergency Medicine

## 2020-12-01 ENCOUNTER — Encounter (HOSPITAL_COMMUNITY): Payer: Self-pay | Admitting: Emergency Medicine

## 2020-12-01 DIAGNOSIS — R4182 Altered mental status, unspecified: Secondary | ICD-10-CM

## 2020-12-01 DIAGNOSIS — R4020 Unspecified coma: Secondary | ICD-10-CM | POA: Insufficient documentation

## 2020-12-01 DIAGNOSIS — R4189 Other symptoms and signs involving cognitive functions and awareness: Secondary | ICD-10-CM

## 2020-12-01 DIAGNOSIS — R519 Headache, unspecified: Secondary | ICD-10-CM | POA: Insufficient documentation

## 2020-12-01 LAB — CBC WITH DIFFERENTIAL/PLATELET
Abs Immature Granulocytes: 0.02 10*3/uL (ref 0.00–0.07)
Basophils Absolute: 0.1 10*3/uL (ref 0.0–0.1)
Basophils Relative: 1 %
Eosinophils Absolute: 0.2 10*3/uL (ref 0.0–0.5)
Eosinophils Relative: 2 %
HCT: 44.1 % (ref 39.0–52.0)
Hemoglobin: 14.8 g/dL (ref 13.0–17.0)
Immature Granulocytes: 0 %
Lymphocytes Relative: 37 %
Lymphs Abs: 2.9 10*3/uL (ref 0.7–4.0)
MCH: 29.5 pg (ref 26.0–34.0)
MCHC: 33.6 g/dL (ref 30.0–36.0)
MCV: 87.8 fL (ref 80.0–100.0)
Monocytes Absolute: 0.6 10*3/uL (ref 0.1–1.0)
Monocytes Relative: 8 %
Neutro Abs: 4.1 10*3/uL (ref 1.7–7.7)
Neutrophils Relative %: 52 %
Platelets: 236 10*3/uL (ref 150–400)
RBC: 5.02 MIL/uL (ref 4.22–5.81)
RDW: 12.5 % (ref 11.5–15.5)
WBC: 7.9 10*3/uL (ref 4.0–10.5)
nRBC: 0 % (ref 0.0–0.2)

## 2020-12-01 LAB — BASIC METABOLIC PANEL
Anion gap: 7 (ref 5–15)
BUN: 21 mg/dL — ABNORMAL HIGH (ref 6–20)
CO2: 26 mmol/L (ref 22–32)
Calcium: 9.2 mg/dL (ref 8.9–10.3)
Chloride: 103 mmol/L (ref 98–111)
Creatinine, Ser: 0.93 mg/dL (ref 0.61–1.24)
GFR, Estimated: >60 mL/min (ref >60)
Glucose, Bld: 103 mg/dL — ABNORMAL HIGH (ref 70–99)
Potassium: 4 mmol/L (ref 3.5–5.1)
Sodium: 136 mmol/L (ref 135–145)

## 2020-12-01 NOTE — ED Provider Notes (Signed)
[facility] EMERGENCY DEPARTMENT Provider Note   CSN: [id number] Arrival date & time: 12/01/20  1728     History Chief Complaint  Patient presents with  . Seizures    Angel Kelley is a 19 y.o. male.  He has a prior history of seizure disorder although the last seizure was over 4 years ago and is not on any medication for it.  He was in his usual state of health in the backseat of a car driving with his family.  Mother states he put his head down and was unresponsive.  They stopped the car and open the door think he was maybe just sleeping and try to get him out of the car but he opened his eyes and did not seem to recognize them.  He was having some difficulty speaking initially.  EMS was called and evaluated him and recommended that they bring him to the hospital.  Since then he is returned to normal level of alertness.  He complained of some right stabbing pain behind his eye radiating to the back of his head.  He was also complaining of some numbness in his left arm and left leg that has improved.  Headache also has improved.  He is somewhat sleepy now but is able to carry on a conversation  The history is provided by the patient and a parent.  Seizures Seizure activity on arrival: no   Seizure type:  Unable to specify Initial focality:  None Episode characteristics: limpness and unresponsiveness   Postictal symptoms: confusion and somnolence   Return to baseline: yes   Severity:  Unable to specify Timing:  Once Number of seizures this episode:  1 Progression:  Resolved Context: not alcohol withdrawal, not change in medication, not sleeping less, not fever, not intracranial lesion and not intracranial shunt   Recent head injury:  No recent head injuries PTA treatment:  None History of seizures: yes        Past Medical History:  Diagnosis Date  . Family history of adverse reaction to anesthesia    mother ponv  . Febrile seizure (HCC) last seizure 2 yrs ago   no cause  found, no neurologist  . GERD (gastroesophageal reflux disease)   . History of migraine   . Lateral meniscus tear    left  . Obese   . Rocky Mountain spotted fever 9 yrs ago    Patient Active Problem List   Diagnosis Date Noted  . Abnormal electroencephalogram (EEG) 09/23/2014  . Obesity 09/23/2014  . Acanthosis nigricans, acquired 09/23/2014  . 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 Sprain of ankle, left 01/11/2013    Past Surgical History:  Procedure Laterality Date  . CIRCUMCISION     2002  . KNEE ARTHROSCOPY WITH LATERAL MENISECTOMY Left 02/29/2020   Procedure: KNEE ARTHROSCOPY WITH LATERAL MENISECTOMY;  Surgeon: Frederico Hamman, MD;  Location: [facility];  Service: Orthopedics;  Laterality: Left;  . NO PAST SURGERIES         Family History  Problem Relation Age of Onset  . Diabetes Sister   . Anuerysm Paternal Grandfather        Died in his 46's   . Heart disease Other   . Arthritis Other   . Cancer Other   . Asthma Other   . Diabetes Other     Social History   Tobacco Use  . Smoking status: Never Smoker  . Smokeless tobacco: Never Used  Vaping Use  .  Vaping Use: Never used  Substance Use Topics  . Alcohol use: No  . Drug use: No    Home Medications Prior to Admission medications   Medication Sig Start Date End Date Taking? Authorizing Provider  acetaminophen (TYLENOL) 500 MG tablet Take 1,500 mg by mouth every 6 (six) hours as needed for moderate pain, headache or fever.   Yes [provider]  Multiple Vitamins-Minerals (MULTIVITAMIN GUMMIES ADULT PO) Take 1 tablet by mouth daily.   Yes [provider]  HYDROcodone-acetaminophen (NORCO/VICODIN) 5-325 MG tablet Take 1 tablet by mouth every 4 (four) hours as needed for moderate pain (may need 1-2 first couple days after surgery). Patient not taking: No sig reported 02/29/20   Chadwell, Ivin BootyJoshua, PA-C  HYDROcodone-acetaminophen  (NORCO/VICODIN) 5-325 MG tablet Take 1 tablet by mouth every 6 (six) hours as needed for moderate pain. Patient not taking: No sig reported 07/14/20   Vanetta MuldersZackowski, Scott, MD  ondansetron (ZOFRAN ODT) 4 MG disintegrating tablet Take 1 tablet (4 mg total) by mouth every 8 (eight) hours as needed. Patient not taking: No sig reported 07/14/20   Vanetta MuldersZackowski, Scott, MD    Allergies    Azithromycin  Review of Systems   Review of Systems  Constitutional: Negative for fever.  HENT: Negative for sore throat.   Eyes: Negative for visual disturbance.  Respiratory: Negative for shortness of breath.   Cardiovascular: Negative for chest pain.  Gastrointestinal: Negative for abdominal pain.  Genitourinary: Negative for dysuria.  Musculoskeletal: Negative for back pain.  Skin: Negative for rash.  Neurological: Positive for seizures (??), syncope (??), numbness and headaches. Negative for weakness.    Physical Exam Updated Vital Signs BP 124/84   Pulse 64   Temp 98.3 F (36.8 C) (Oral)   Resp 18   Ht 6\' 4"  (1.93 m)   Wt (!) 158.8 kg   SpO2 97%   BMI 42.60 kg/m   Physical Exam Vitals and nursing note reviewed.  Constitutional:      Appearance: Normal appearance. He is well-developed and well-nourished.  HENT:     Head: Normocephalic and atraumatic.  Eyes:     Conjunctiva/sclera: Conjunctivae normal.  Cardiovascular:     Rate and Rhythm: Normal rate and regular rhythm.     Heart sounds: No murmur heard.   Pulmonary:     Effort: Pulmonary effort is normal. No respiratory distress.     Breath sounds: Normal breath sounds.  Abdominal:     Palpations: Abdomen is soft.     Tenderness: There is no abdominal tenderness.  Musculoskeletal:        General: No deformity, signs of injury or edema. Normal range of motion.     Cervical back: Neck supple.  Skin:    General: Skin is warm and dry.     Capillary Refill: Capillary refill takes less than 2 seconds.  Neurological:     General: No focal  deficit present.     Mental Status: He is alert and oriented to person, place, and time.     Cranial Nerves: No cranial nerve deficit.     Sensory: Sensory deficit present.     Motor: No weakness.     Comments: Patient still complaining of some subjective decrease sensation from his left knee to his foot in a stocking distribution.  Psychiatric:        Mood and Affect: Mood and affect normal.     ED Results / Procedures / Treatments   Labs (all labs ordered are listed,  but only abnormal results are displayed) Labs Reviewed  BASIC METABOLIC PANEL - Abnormal; Notable for the following components:      Result Value   Glucose, Bld 103 (*)    BUN 21 (*)    All other components within normal limits  CBC WITH DIFFERENTIAL/PLATELET    EKG EKG Interpretation  Date/Time:  Monday December 01 2020 22:47:47 EST Ventricular Rate:  55 PR Interval:    QRS Duration: 107 QT Interval:  417 QTC Calculation: 399 R Axis:   33 Text Interpretation: Sinus rhythm normal intervals no acute st/ts No significant change since prior 8/17 Confirmed by Meridee Score [phone]) on 12/01/2020 10:51:09 PM   Radiology CT Head Wo Contrast  Result Date: 12/01/2020 CLINICAL DATA:  Seizure EXAM: CT HEAD WITHOUT CONTRAST TECHNIQUE: Contiguous axial images were obtained from the base of the skull through the vertex without intravenous contrast. COMPARISON:  CT brain 06/03/2011 FINDINGS: Brain: No evidence of acute infarction, hemorrhage, hydrocephalus, extra-axial collection or mass lesion/mass effect. Vascular: No hyperdense vessel or unexpected calcification. Skull: Normal. Negative for fracture or focal lesion. Sinuses/Orbits: No acute finding. Other: None IMPRESSION: Negative non contrasted CT appearance of the brain. Electronically Signed   By: Jasmine Pang M.D.   On: 12/01/2020 22:43    Procedures Procedures (including critical care time)  Medications Ordered in ED Medications - No data to display  ED  Course  I have reviewed the triage vital signs and the nursing notes.  Pertinent labs & imaging results that were available during my care of the patient were reviewed by me and considered in my medical decision making (see chart for details).  Clinical Course as of 12/02/20 0951  Mon Dec 01, 2020  2249 Reviewed last note from neurology in 2015.  At that time they called this complex partial seizure without status epilepticus.  Because it been so long since his seizure before that they elected not to put him on any medication.  He has not seen neurology since then. [MB]    Clinical Course User Index [MB] Terrilee Files, MD   MDM Rules/Calculators/A&P                         This patient complains of unresponsive episode possible seizure; this involves an extensive number of treatment Options and is a complaint that carries with it a high risk of complications and Morbidity. The differential includes seizure, syncope, sleep apnea metabolic derangement intoxication  I ordered, reviewed and interpreted labs, which included CBC with normal white count normal hemoglobin, chemistries normal minimally elevated glucose I ordered imaging studies which included CT head and I independently    visualized and interpreted imaging which showed no acute findings Additional history obtained from patient's mother Previous records obtained and reviewed in epic including prior neurology notes  After the interventions stated above, I reevaluated the patient and found him to be sleeping soundly.  Mother states he is always difficult to arouse after he goes to sleep.  Signed out to oncoming provider Dr. Clayborne Dana to follow-up on BMP and make sure patient can ambulate safely.  Mother comfortable plan for outpatient follow-up with neurology.  Reviewed instructions not to drive until seen by neurology and mother states that patient does not drive   Final Clinical Impression(s) / ED Diagnoses Final diagnoses:   Unresponsive episode  Altered mental status, unspecified altered mental status type    Rx / DC Orders ED Discharge Orders  None       Terrilee Files, MD 12/02/20 [phone]

## 2020-12-01 NOTE — Discharge Instructions (Addendum)
You were seen in the emergency department for an unresponsive episode.  This was possibly a seizure.  You had blood work and a CAT scan that did not show an obvious explanation for your symptoms.  You should follow-up with neurology for further evaluation.  You should not drive until you are seen by neurology and cleared by them.  Return to the emergency department for any worsening or concerning symptoms

## 2020-12-01 NOTE — ED Notes (Signed)
Pt is aware a urine sample is needed. States he does not need to go at this time.

## 2020-12-01 NOTE — ED Notes (Signed)
Patient transported to CT 

## 2020-12-01 NOTE — ED Triage Notes (Signed)
Pt mother states pt had a 3-4 min episode of LOC in a "post ictal state." Pt has hx of seizures, last known seizure was in approx 4 years ago.

## 2020-12-02 NOTE — ED Provider Notes (Addendum)
Informed by nursing that patient ambulated fine. Bmp/cbc are unremarkable. Ct head looks ok. ecg looks ok. Appears to be stable for discharge with neuro follow up per previous providers verbally communicated plan.    Arth Nicastro, Barbara Cower, MD 12/02/20 [phone]

## 2020-12-02 NOTE — ED Notes (Signed)
Pt ambulated to hallway with standby assistance with steady gait.

## 2021-05-14 ENCOUNTER — Encounter (HOSPITAL_COMMUNITY): Payer: Self-pay | Admitting: Emergency Medicine

## 2021-05-14 ENCOUNTER — Emergency Department (HOSPITAL_COMMUNITY): Payer: Self-pay

## 2021-05-14 ENCOUNTER — Emergency Department (HOSPITAL_COMMUNITY)
Admission: EM | Admit: 2021-05-14 | Discharge: 2021-05-14 | Disposition: A | Discharge status: Home / Self Care | Payer: Self-pay | Attending: Emergency Medicine | Admitting: Emergency Medicine

## 2021-05-14 DIAGNOSIS — R197 Diarrhea, unspecified: Secondary | ICD-10-CM | POA: Insufficient documentation

## 2021-05-14 DIAGNOSIS — R5383 Other fatigue: Secondary | ICD-10-CM | POA: Insufficient documentation

## 2021-05-14 DIAGNOSIS — R519 Headache, unspecified: Secondary | ICD-10-CM | POA: Insufficient documentation

## 2021-05-14 DIAGNOSIS — R1013 Epigastric pain: Secondary | ICD-10-CM | POA: Insufficient documentation

## 2021-05-14 DIAGNOSIS — R1010 Upper abdominal pain, unspecified: Secondary | ICD-10-CM | POA: Insufficient documentation

## 2021-05-14 DIAGNOSIS — R112 Nausea with vomiting, unspecified: Secondary | ICD-10-CM | POA: Insufficient documentation

## 2021-05-14 DIAGNOSIS — K219 Gastro-esophageal reflux disease without esophagitis: Secondary | ICD-10-CM | POA: Insufficient documentation

## 2021-05-14 DIAGNOSIS — R42 Dizziness and giddiness: Secondary | ICD-10-CM | POA: Insufficient documentation

## 2021-05-14 DIAGNOSIS — M542 Cervicalgia: Secondary | ICD-10-CM | POA: Insufficient documentation

## 2021-05-14 LAB — COMPREHENSIVE METABOLIC PANEL
ALT: 26 U/L (ref 0–44)
AST: 22 U/L (ref 15–41)
Albumin: 3.7 g/dL (ref 3.5–5.0)
Alkaline Phosphatase: 39 U/L (ref 38–126)
Anion gap: 6 (ref 5–15)
BUN: 9 mg/dL (ref 6–20)
CO2: 27 mmol/L (ref 22–32)
Calcium: 8.7 mg/dL — ABNORMAL LOW (ref 8.9–10.3)
Chloride: 104 mmol/L (ref 98–111)
Creatinine, Ser: 1.01 mg/dL (ref 0.61–1.24)
GFR, Estimated: >60 mL/min (ref >60)
Glucose, Bld: 95 mg/dL (ref 70–99)
Potassium: 4 mmol/L (ref 3.5–5.1)
Sodium: 137 mmol/L (ref 135–145)
Total Bilirubin: 0.6 mg/dL (ref 0.3–1.2)
Total Protein: 6.4 g/dL — ABNORMAL LOW (ref 6.5–8.1)

## 2021-05-14 LAB — CBC WITH DIFFERENTIAL/PLATELET
Abs Immature Granulocytes: 0.01 10*3/uL (ref 0.00–0.07)
Basophils Absolute: 0.1 10*3/uL (ref 0.0–0.1)
Basophils Relative: 1 %
Eosinophils Absolute: 0.1 10*3/uL (ref 0.0–0.5)
Eosinophils Relative: 1 %
HCT: 41.9 % (ref 39.0–52.0)
Hemoglobin: 14.3 g/dL (ref 13.0–17.0)
Immature Granulocytes: 0 %
Lymphocytes Relative: 38 %
Lymphs Abs: 3 10*3/uL (ref 0.7–4.0)
MCH: 29.5 pg (ref 26.0–34.0)
MCHC: 34.1 g/dL (ref 30.0–36.0)
MCV: 86.6 fL (ref 80.0–100.0)
Monocytes Absolute: 0.6 10*3/uL (ref 0.1–1.0)
Monocytes Relative: 7 %
Neutro Abs: 4.2 10*3/uL (ref 1.7–7.7)
Neutrophils Relative %: 53 %
Platelets: 214 10*3/uL (ref 150–400)
RBC: 4.84 MIL/uL (ref 4.22–5.81)
RDW: 12.1 % (ref 11.5–15.5)
WBC: 8 10*3/uL (ref 4.0–10.5)
nRBC: 0 % (ref 0.0–0.2)

## 2021-05-14 LAB — URINALYSIS, ROUTINE W REFLEX MICROSCOPIC
Bilirubin Urine: NEGATIVE
Glucose, UA: NEGATIVE mg/dL
Hgb urine dipstick: NEGATIVE
Ketones, ur: NEGATIVE mg/dL
Leukocytes,Ua: NEGATIVE
Nitrite: NEGATIVE
Protein, ur: NEGATIVE mg/dL
Specific Gravity, Urine: 1.025 (ref 1.005–1.030)
pH: 5 (ref 5.0–8.0)

## 2021-05-14 LAB — LIPASE, BLOOD: Lipase: 30 U/L (ref 11–51)

## 2021-05-14 MED ORDER — SODIUM CHLORIDE 0.9 % IV BOLUS
1000.0000 mL | Freq: Once | INTRAVENOUS | Status: AC
  Administered 2021-05-14: 10:00:00 1000 mL via INTRAVENOUS

## 2021-05-14 MED ORDER — IOHEXOL 350 MG/ML SOLN
75.0000 mL | Freq: Once | INTRAVENOUS | Status: AC | PRN
  Administered 2021-05-14: 12:00:00 75 mL via INTRAVENOUS

## 2021-05-14 NOTE — Discharge Instructions (Addendum)
I would recommend you continue taking your Nexium for the next 2 weeks.  You can continue taking Antivert as needed.  This medication can be sedating so do not mix with alcohol or operate a motor vehicle after taking it.  Please follow-up with your regular doctor regarding your symptoms.  If you develop any new or worsening symptoms, please come back to the emergency department for reevaluation.  It was a pleasure to meet you.

## 2021-05-14 NOTE — ED Provider Notes (Signed)
[facility]   CSN: [id number] Arrival date & time: 05/14/21  [id number]     History Chief Complaint  Patient presents with   Neck Pain    Angel Kelley is a 20 y.o. male.  HPI  Patient is a 20 year old male with a history of epilepsy, RMSF, migraine, who presents to the emergency department due to dizziness.  Patient states that about 5 to 6 days ago he was twisting to the left to hug his mother and felt a pop in his back.  He states that after this occurred he began experiencing intermittent lightheadedness.  Also complains that at times he will turn his head quickly and also experience dizziness.  He states it feels as if the room is spinning.  This will last for short period of time and then spontaneously alleviate.  Reports associated nausea/vomiting when this worsens.  Also complains of neck pain radiating to the crown of the head.  States it feels like "an electric shock".  It worsens when lying flat.  States he has not vomited for about 3 days.  He also reports generalized fatigue.  No unilateral weakness or numbness.  He confirms his history of seizures but is not on any medications for his seizures due to their infrequent occurrence.  He states he most recently had a seizure last year.  Patient also complains of upper abdominal pain that started about 4 weeks ago.  He states that it worsens when eating.  He reports associated nausea, vomiting, and diarrhea due to his symptoms.  [facility].  Lipase mildly elevated at 88.  Ultrasound was obtained of the right upper quadrant which was reassuring.     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) 09/06/2014   Sprain of ankle, left 01/11/2013    Past Surgical History:  Procedure Laterality Date   CIRCUMCISION     2002   KNEE ARTHROSCOPY WITH LATERAL MENISECTOMY Left 02/29/2020   Procedure: KNEE ARTHROSCOPY WITH LATERAL MENISECTOMY;  Surgeon: Frederico Hammanaffrey, Daniel, MD;  [facility];  Service: Orthopedics;  Laterality: Left;   NO PAST SURGERIES         Family History  Problem Relation Age of Onset   Diabetes Sister    Anuerysm Paternal Grandfather        Died in his 6850's    Heart disease Other    Arthritis Other    Cancer Other    Asthma Other    Diabetes Other     Social History   Tobacco Use   Smoking status: Never   Smokeless tobacco: Never  Vaping Use   Vaping Use: Never used  Substance Use Topics   Alcohol use: No   Drug use: No    Home Medications Prior to Admission medications   Medication Sig Start Date End  Date Taking? Authorizing Provider  acetaminophen (TYLENOL) 500 MG tablet Take 1,500 mg by mouth every 6 (six) hours as needed for moderate pain, headache or fever.    [provider]  Multiple Vitamins-Minerals (MULTIVITAMIN GUMMIES ADULT PO) Take 1 tablet by mouth daily.    [provider]    Allergies    Azithromycin  Review of Systems   Review of Systems  All other systems reviewed and are negative. Ten systems reviewed and are negative for acute change, except as noted in the HPI.   Physical Exam Updated Vital Signs BP (!) 112/53   Pulse 66   Temp 98.1 F (36.7 C) (Oral)   Resp 12   SpO2 98%   Physical Exam Vitals and nursing note reviewed.  Constitutional:      General: He is not in acute  distress.    Appearance: Normal appearance. He is not ill-appearing, toxic-appearing or diaphoretic.  HENT:     Head: Normocephalic and atraumatic.     Right Ear: External ear normal.     Left Ear: External ear normal.     Nose: Nose normal.     Mouth/Throat:     Mouth: Mucous membranes are moist.     Pharynx: Oropharynx is clear. No oropharyngeal exudate or posterior oropharyngeal erythema.  Eyes:     General: No scleral icterus.       Right eye: No discharge.        Left eye: No discharge.     Extraocular Movements: Extraocular movements intact.     Conjunctiva/sclera: Conjunctivae normal.     Pupils: Pupils are equal, round, and reactive to light.  Cardiovascular:     Rate and Rhythm: Normal rate and regular rhythm.     Pulses: Normal pulses.     Heart sounds: Normal heart sounds. No murmur heard.   No friction rub. No gallop.  Pulmonary:     Effort: Pulmonary effort is normal. No respiratory distress.     Breath sounds: Normal breath sounds. No stridor. No wheezing, rhonchi or rales.  Abdominal:     General: Abdomen is flat.     Palpations: Abdomen is soft.     Tenderness: There is abdominal tenderness.     Comments: Protuberant abdomen that is soft.  Mild tenderness noted along the epigastrium.    Musculoskeletal:        General: Normal range of motion.     Cervical back: Normal range of motion and neck supple. No tenderness.  Skin:    General: Skin is warm and dry.  Neurological:     General: No focal deficit present.     Mental Status: He is alert and oriented to person, place, and time.     Comments: Patient is oriented to person, place, and time. Patient phonates in clear, complete, and coherent sentences. Negative arm drift. Strength is 5/5 in all four extremities. Distal sensation intact in all four extremities.  Psychiatric:        Mood and Affect: Mood normal.        Behavior: Behavior normal.   ED Results / Procedures / Treatments   Labs (all labs ordered  are listed, but only abnormal results are displayed) Labs Reviewed  COMPREHENSIVE METABOLIC PANEL - Abnormal; Notable for the following components:      Result Value   Calcium 8.7 (*)    Total Protein 6.4 (*)    All other components within normal limits  CBC WITH DIFFERENTIAL/PLATELET  URINALYSIS, ROUTINE W  REFLEX MICROSCOPIC  LIPASE, BLOOD    EKG None  Radiology CT Angio Head W or Wo Contrast  Result Date: 05/14/2021 CLINICAL DATA:  Dizziness.  Vertiginous symptoms. EXAM: CT HEAD WITHOUT CONTRAST CT ANGIOGRAPHY OF THE HEAD AND NECK TECHNIQUE: Contiguous axial images were obtained from the base of the skull through the vertex without intravenous contrast. Multidetector CT imaging of the head and neck was performed using the standard protocol during bolus administration of intravenous contrast. Multiplanar CT image reconstructions and MIPs were obtained to evaluate the vascular anatomy. Carotid stenosis measurements (when applicable) are obtained utilizing NASCET criteria, using the distal internal carotid diameter as the denominator. CONTRAST:  35mL OMNIPAQUE IOHEXOL 350 MG/ML SOLN COMPARISON:  CT head 12/01/2020. FINDINGS: CT HEAD Brain: No evidence of acute large vascular territory infarction, hemorrhage, hydrocephalus, extra-axial collection or mass lesion/mass effect. Vascular: See below. Skull: No acute fracture. Sinuses/Orbits: Visualized sinuses are clear.  No mastoid effusions. CTA NECK Aortic arch: Great vessel origins are patent. Right carotid system: No significant (greater than 50%) stenosis or evidence of dissection. Mildly tortuous ICA at the skull base. Left carotid system: No significant (greater than 50%) stenosis or evidence of dissection. Mildly tortuous ICA at the skull base. Vertebral arteries:Codominant. No significant (greater than 50%) stenosis or evidence of dissection. Skeleton: Mild reversal of the normal cervical lordosis. Other neck: No evidence of acute abnormality. CTA  HEAD Anterior circulation: No large vessel occlusion or proximal hemodynamically significant stenosis. No visible aneurysm. Posterior circulation: Bilateral intradural vertebral arteries, basilar artery, and posterior cerebral arteries are patent without evidence of proximal hemodynamically significant stenosis. Distal PCA evaluation is limited due to venous contamination. No aneurysm. Venous sinuses: No evidence of dural venous sinus thrombosis. Small left transverse and sigmoid sinuses. IMPRESSION: 1. No large vessel occlusion or proximal hemodynamically significant stenosis. 2. No evidence of dissection. Electronically Signed   By: Feliberto Harts MD   On: 05/14/2021 12:06   CT Angio Neck W and/or Wo Contrast  Result Date: 05/14/2021 CLINICAL DATA:  Dizziness.  Vertiginous symptoms. EXAM: CT HEAD WITHOUT CONTRAST CT ANGIOGRAPHY OF THE HEAD AND NECK TECHNIQUE: Contiguous axial images were obtained from the base of the skull through the vertex without intravenous contrast. Multidetector CT imaging of the head and neck was performed using the standard protocol during bolus administration of intravenous contrast. Multiplanar CT image reconstructions and MIPs were obtained to evaluate the vascular anatomy. Carotid stenosis measurements (when applicable) are obtained utilizing NASCET criteria, using the distal internal carotid diameter as the denominator. CONTRAST:  58mL OMNIPAQUE IOHEXOL 350 MG/ML SOLN COMPARISON:  CT head 12/01/2020. FINDINGS: CT HEAD Brain: No evidence of acute large vascular territory infarction, hemorrhage, hydrocephalus, extra-axial collection or mass lesion/mass effect. Vascular: See below. Skull: No acute fracture. Sinuses/Orbits: Visualized sinuses are clear.  No mastoid effusions. CTA NECK Aortic arch: Great vessel origins are patent. Right carotid system: No significant (greater than 50%) stenosis or evidence of dissection. Mildly tortuous ICA at the skull base. Left carotid system: No  significant (greater than 50%) stenosis or evidence of dissection. Mildly tortuous ICA at the skull base. Vertebral arteries:Codominant. No significant (greater than 50%) stenosis or evidence of dissection. Skeleton: Mild reversal of the normal cervical lordosis. Other neck: No evidence of acute abnormality. CTA HEAD Anterior circulation: No large vessel occlusion or proximal hemodynamically significant stenosis. No visible aneurysm. Posterior circulation: Bilateral intradural vertebral arteries, basilar artery, and posterior cerebral arteries are patent without evidence of proximal hemodynamically significant stenosis. Distal PCA evaluation is  limited due to venous contamination. No aneurysm. Venous sinuses: No evidence of dural venous sinus thrombosis. Small left transverse and sigmoid sinuses. IMPRESSION: 1. No large vessel occlusion or proximal hemodynamically significant stenosis. 2. No evidence of dissection. Electronically Signed   By: Feliberto Harts MD   On: 05/14/2021 12:06    Procedures Procedures   Medications Ordered in ED Medications  sodium chloride 0.9 % bolus 1,000 mL (0 mLs Intravenous Stopped 05/14/21 1243)  iohexol (OMNIPAQUE) 350 MG/ML injection 75 mL (75 mLs Intravenous Contrast Given 05/14/21 1147)    ED Course  I have reviewed the triage vital signs and the nursing notes.  Pertinent labs & imaging results that were available during my care of the patient were reviewed by me and considered in my medical decision making (see chart for details).    MDM Rules/Calculators/A&P                          Pt is a 20 y.o. male who presents to the emergency department due to neck pain, headaches, as well as epigastric pain.  Labs: CBC without abnormalities. CMP with a calcium of 8.7 and a total protein of 6.4. UA negative. Lipase within normal limits at 30.  Imaging: CT of the head and neck shows no large vessel occlusion or proximal hemodynamically significant stenosis.  There  is no evidence of dissection.  I, Placido Sou, PA-C, personally reviewed and evaluated these images and lab results as part of my medical decision-making.  Physical exam, labs, and imaging is reassuring.  Recommended patient continue taking Nexium as well as Antivert as needed.  PCP follow-up.  Return to the emergency department with worsening symptoms.  He verbalized understanding of the above plan.  Feel he is stable for discharge at this time and he is agreeable.  His questions were answered and he was amicable at the time of discharge.  Note: Portions of this report may have been transcribed using voice recognition software. Every effort was made to ensure accuracy; however, inadvertent computerized transcription errors may be present.   Final Clinical Impression(s) / ED Diagnoses Final diagnoses:  Neck pain  Nonintractable headache, unspecified chronicity pattern, unspecified headache type  Epigastric pain    Rx / DC Orders ED Discharge Orders     None        Placido Sou, PA-C 05/14/21 1311    Margarita Grizzle, MD 05/15/21 [phone]

## 2021-05-14 NOTE — ED Triage Notes (Signed)
Patient here with complaint of dizziness, neck pain, and abdominal pain that started last Saturday. Was seen at [facility] a few days ago and diagnosed with vertigo, patient requesting second opinion. Patient alert, oriented, ambulatory and in no apparent distress at this time.

## 2021-06-10 ENCOUNTER — Encounter: Payer: Self-pay | Admitting: Family Medicine

## 2021-06-10 ENCOUNTER — Ambulatory Visit: Payer: Self-pay | Admitting: Family Medicine

## 2021-10-17 ENCOUNTER — Encounter: Payer: Self-pay | Admitting: Family Medicine

## 2021-10-19 NOTE — Telephone Encounter (Signed)
Nurses Please communicate with Nate I am more than willing to sit down with him and have a discussion regarding this issue.  I believe a sensitive issue like this deserves face-to-face discussion.  Our office can offer discount for the fact that he does not have insurance.

## 2021-12-09 ENCOUNTER — Telehealth: Payer: Self-pay | Admitting: Family Medicine

## 2021-12-09 NOTE — Telephone Encounter (Signed)
Pt called and stated that he began Covid symptoms on 12/04/21. He took a home test from [employer] and tested positive. Employer will not let him return without a note from the doctor. Pt does not have an appt. [phone].

## 2021-12-09 NOTE — Telephone Encounter (Signed)
Coral Spikes, DO    May return on 1/5. Please send a letter in for him.   Thersa Salt DO

## 2021-12-10 ENCOUNTER — Encounter: Payer: Self-pay | Admitting: Family Medicine
